# Patient Record
Sex: Male | Born: 2000 | Race: Black or African American | Hispanic: No | Marital: Single | Smoking: Never smoker
Health system: Southern US, Community
[De-identification: ages and names within clinical notes are randomized; demographics above are authoritative.]

## PROBLEM LIST (undated history)

## (undated) DIAGNOSIS — K589 Irritable bowel syndrome without diarrhea: Secondary | ICD-10-CM

## (undated) HISTORY — PX: COLONOSCOPY: SHX174

---
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
## 2021-05-09 ENCOUNTER — Ambulatory Visit (HOSPITAL_COMMUNITY)
Admission: EM | Admit: 2021-05-09 | Discharge: 2021-05-09 | Disposition: A | Discharge status: Home / Self Care | Payer: Medicaid Other | Attending: Psychiatry | Admitting: Psychiatry

## 2021-05-09 ENCOUNTER — Other Ambulatory Visit: Payer: Self-pay

## 2021-05-09 DIAGNOSIS — F4323 Adjustment disorder with mixed anxiety and depressed mood: Secondary | ICD-10-CM | POA: Insufficient documentation

## 2021-05-09 NOTE — Discharge Instructions (Addendum)
Please establish care with a therapist for the new school year.

## 2021-05-09 NOTE — ED Provider Notes (Signed)
[facility] Medical Screening Exam  Patient Name: Kevin Rodgers MRN: [id number] Date of Evaluation: 05/09/21 Chief Complaint:  "I need to show my school I'm getting help." Diagnosis:  Final diagnoses:  Adjustment disorder with mixed anxiety and depressed mood    History of Present illness: Kevin Rodgers is a 20 y.o. male. W/ no prior psych hx who presents today with his mother. Patient reports "I have been feeling depressed for about 1 year and I ned to show my school that I am getting help." Patient's mother is in the room with him, but patient does most of the talking for his exam. 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 Patient reports he transferred from [employer]. [employer] to this college for football and he did well in his first semester and his summer semester; however around 05/2020 patient was cut from the football team. Patient reports he has been playing football since he was 20 yo and began to wonder "What am I supposed to be doing." Patient reports he was attending the school with both academic and sports scholarship and has now lost his sports scholarship. Patient's mother reports she and the patient's father make up the difference and are not concerned; however patient's grades dropped over the last year and patient made 1 F and had a few D's. Patient reports he was having a hard time going to class and endorses hypersomnia and decreased motivation. Patient reports "I don't know what was wrong." Patient reports that he also had significant decrease in appetite, poor concentration, decreased energy and endorse psychomotor retardation. Patient endorsed that lately he has has returned to a more normla sleep schedule since he has been home for the summer, but he notes that he has trouble falling asleep as he finds himself wondering about his future and what he can do without football in his life. Patient reports he  finds himself constantly worrying and wondering about life and "I feel like time is moving too fast." Patient denies SI, HI, and AVH and has not hx of self-injurious behavior nor manic episodes. Patient denies illicit substance use.  Mom denies FH of mental health disorders.   Psychiatric Specialty Exam  Presentation  General Appearance:Appropriate for Environment  Eye Contact:Good  Speech:Clear and Coherent  Speech Volume:Normal  Handedness:No data recorded  Mood and Affect  Mood:Dysphoric  Affect:Flat   Thought Process  Thought Processes:Coherent  Descriptions of Associations:Intact  Orientation:Full (Time, Place and Person)  Thought Content:Logical    Hallucinations:None  Ideas of Reference:None  Suicidal Thoughts:No  Homicidal Thoughts:No   Sensorium  Memory:Immediate Good; Recent Good; Remote Good  Judgment:Fair  Insight:Present   Executive Functions  Concentration:Fair  Attention Span:Good  Recall:Fair  Fund of Knowledge:Good  Language:Good   Psychomotor Activity  Psychomotor Activity:Normal   Assets  Assets:Communication Skills; Desire for Improvement; Housing; Resilience; Social Support   Sleep  Sleep:Fair  Number of hours:  No data recorded  No data recorded  Physical Exam: Physical Exam Constitutional:      Appearance: Normal appearance.  HENT:     Head: Normocephalic and atraumatic.     Nose: Nose normal.  Eyes:     Extraocular Movements: Extraocular movements intact.     Pupils: Pupils are equal, round, and reactive to light.  Cardiovascular:     Rate and Rhythm: Normal rate.     Pulses: Normal pulses.  Pulmonary:     Effort: Pulmonary effort is normal.  Musculoskeletal:        General: Normal range of motion.  Skin:    General: Skin is warm and dry.  Neurological:     General: No focal deficit present.     Mental Status: He is alert.   Review of Systems  Constitutional:  Negative for chills and fever.   HENT:  Negative for hearing loss.   Eyes:  Negative for blurred vision.  Respiratory:  Negative for cough and wheezing.   Cardiovascular:  Negative for chest pain.  Gastrointestinal:  Negative for abdominal pain.  Neurological:  Negative for dizziness.  Psychiatric/Behavioral:  Negative for substance abuse and suicidal ideas.   Blood pressure 125/89, pulse (!) 48, temperature 97.9 F (36.6 C), temperature source Oral, resp. rate 16, SpO2 99 %. There is no height or weight on file to calculate BMI.  Musculoskeletal: Strength & Muscle Tone: within normal limits Gait & Station: normal Patient leans: N/A   [facility] MSE Discharge Disposition for Follow up and Recommendations: Based on my evaluation the patient does not appear to have an emergency medical condition and can be discharged with resources and follow up care in outpatient services for Individual Therapy Adjustment disorder w/ depression and anxiety Patient endorses a significant event that has caused him stress prior to the onset of his depressive and anxious symptoms. It appears that this stressor is directly related to his change in mood. Patient mom noted that patient did not reach out for help during his last school year and has always been reluctant to talk to others when he faces a problem. Both patient and mother agreed that OP therapy would be best for patient. Patient and mother did not feel that patient need a recommendation to psychiatry for medication management at this time. With shared-decision making it was agreed that patient would try therapy first but would consider medication management in the future if he is not having much progress with therapy alone. Mom reported that she would communicate with the patient more to check in how he was doing in school and following through on attending therapy.   - SW provided OP therapy options in the [place] community near patient's school. Patient has personal transportation should he  chose therapy off campus. - School note was provided to patient clearing patient for return to school  PGY-2 Bobbye Morton, MD 05/09/2021, 10:02 AM

## 2021-05-09 NOTE — BH Assessment (Signed)
TTS triage: Patient presents to [name] with his mother, Kevin Rodgers. He reports feeling increasingly depressed for the past year and his grades began to slip at school. He states he needs to show that he has been trying to get help for his mental health. He denies SI/HI/AVH. He denies any substance use or physical concerns.  Patient is routine.

## 2022-10-06 ENCOUNTER — Other Ambulatory Visit: Payer: Self-pay

## 2022-10-06 ENCOUNTER — Emergency Department: Payer: Medicaid Other

## 2022-10-06 ENCOUNTER — Emergency Department
Admission: EM | Admit: 2022-10-06 | Discharge: 2022-10-06 | Disposition: A | Discharge status: Home / Self Care | Payer: Medicaid Other | Attending: Student in an Organized Health Care Education/Training Program | Admitting: Student in an Organized Health Care Education/Training Program

## 2022-10-06 DIAGNOSIS — S93401A Sprain of unspecified ligament of right ankle, initial encounter: Secondary | ICD-10-CM | POA: Insufficient documentation

## 2022-10-06 DIAGNOSIS — Y9289 Other specified places as the place of occurrence of the external cause: Secondary | ICD-10-CM | POA: Insufficient documentation

## 2022-10-06 DIAGNOSIS — S99912A Unspecified injury of left ankle, initial encounter: Secondary | ICD-10-CM | POA: Diagnosis present

## 2022-10-06 DIAGNOSIS — Y998 Other external cause status: Secondary | ICD-10-CM | POA: Diagnosis not present

## 2022-10-06 DIAGNOSIS — Y9389 Activity, other specified: Secondary | ICD-10-CM | POA: Diagnosis not present

## 2022-10-06 MED ORDER — OXYCODONE-ACETAMINOPHEN 5-325 MG PO TABS: 1.0000 | ORAL_TABLET | ORAL | 0 refills | Status: AC | PRN

## 2022-10-06 MED ORDER — OXYCODONE-ACETAMINOPHEN 5-325 MG PO TABS: 1.0000 | ORAL_TABLET | ORAL | 0 refills | Status: DC | PRN

## 2022-10-06 MED ORDER — OXYCODONE-ACETAMINOPHEN 5-325 MG PO TABS
1.0000 | ORAL_TABLET | Freq: Once | ORAL | Status: AC
  Administered 2022-10-06: 1 via ORAL
  Filled 2022-10-06: qty 1

## 2022-10-06 NOTE — ED Triage Notes (Signed)
Pt states he was stepping off his work [employer] and twisting his L ankle- pt states he put his whole weight on the ankle when it happened

## 2022-10-06 NOTE — ED Provider Notes (Signed)
[facility] Provider Note    Event Date/Time   First MD Initiated Contact with Patient 10/06/22 1720     (approximate)   History   Ankle Pain   HPI  Kevin Rodgers is a 22 y.o. 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  Stepped out of the [place] landed on his and rolled left ankle.  Felt his ankle hit the ground.  He denied any pain but did feel a popping sensation in his ankle has had pain with weightbearing since.  Denies any other injury.     Physical Exam   Triage Vital Signs: ED Triage Vitals  Enc Vitals Group     BP 10/06/22 1713 126/82     Pulse Rate 10/06/22 1713 62     Resp 10/06/22 1713 18     Temp 10/06/22 1713 98.4 F (36.9 C)     Temp Source 10/06/22 1713 Oral     SpO2 10/06/22 1713 95 %     Weight 10/06/22 1714 150 lb (68 kg)     Height 10/06/22 1714 6' (1.829 m)     Head Circumference --      Peak Flow --      Pain Score 10/06/22 1714 10     Pain Loc --      Pain Edu? --      Excl. in GC? --     Most recent vital signs: Vitals:   10/06/22 1713  BP: 126/82  Pulse: 62  Resp: 18  Temp: 98.4 F (36.9 C)  SpO2: 95%     Constitutional: Alert  Eyes: Conjunctivae are normal.  Head: Atraumatic. Nose: No congestion/rhinnorhea. Mouth/Throat: Mucous membranes are moist.   Neck: Painless ROM.  Cardiovascular:   Good peripheral circulation. Respiratory: Normal respiratory effort.  No retractions.  Gastrointestinal: Soft and nontender.  Musculoskeletal: There are some swelling over the left lateral ankle.  No effusion.  Neurovascular intact distally.  No midfoot instability.  No proximal tenderness to palpation of the leg. Neurologic:  MAE spontaneously. No gross focal neurologic deficits are appreciated.  Skin:  Skin is warm, dry and intact. No rash noted. Psychiatric: Mood and affect are normal. Speech and behavior are normal.    ED Results / Procedures  / Treatments   Labs (all labs ordered are listed, but only abnormal results are displayed) Labs Reviewed - No data to display   EKG     RADIOLOGY Please see ED Course for my review and interpretation.  I personally reviewed all radiographic images ordered to evaluate for the above acute complaints and reviewed radiology reports and findings.  These findings were personally discussed with the patient.  Please see medical record for radiology report.    PROCEDURES:  Critical Care performed:   Procedures   MEDICATIONS ORDERED IN ED: Medications  oxyCODONE-acetaminophen (PERCOCET/ROXICET) 5-325 MG per tablet 1 tablet (has no administration in time range)     IMPRESSION / MDM / ASSESSMENT AND PLAN / ED COURSE  I reviewed the triage vital signs and the nursing notes.                              Differential diagnosis includes, but is not limited to, fracture, sprain, contusion, dislocation  22 y.o. male with left ankle injury. Patient is AFVSS in ED. Exam as above. Given current presentation have considered the  above differential. Denies any other injuries. Denies motor or sensory loss. Able to bear weight. Afebrile and VSS in Ed. Exam as above. NV intact throughout and distal to injury. Pt able to range joint. No clinical suspicion for infectious process or septic joint. Treatments will include observation, X-rays.  X-rays w/o fracture. No other injuries reported or noted on exam. Presentation c/w sprain. Discussed supportive care and follow up with pt.        FINAL CLINICAL IMPRESSION(S) / ED DIAGNOSES   Final diagnoses:  Sprain of right ankle, unspecified ligament, initial encounter     Rx / DC Orders   ED Discharge Orders          Ordered    oxyCODONE-acetaminophen (PERCOCET) 5-325 MG tablet  Every 4 hours PRN        10/06/22 1750             Note:  This document was prepared using Dragon voice recognition software and may include unintentional  dictation errors.    Merlyn Lot, MD 10/06/22 [phone]

## 2023-08-13 ENCOUNTER — Emergency Department (HOSPITAL_BASED_OUTPATIENT_CLINIC_OR_DEPARTMENT_OTHER)
Admission: EM | Admit: 2023-08-13 | Discharge: 2023-08-13 | Disposition: A | Discharge status: Home / Self Care | Payer: 59 | Attending: Emergency Medicine | Admitting: Emergency Medicine

## 2023-08-13 ENCOUNTER — Other Ambulatory Visit: Payer: Self-pay

## 2023-08-13 ENCOUNTER — Encounter (HOSPITAL_BASED_OUTPATIENT_CLINIC_OR_DEPARTMENT_OTHER): Payer: Self-pay

## 2023-08-13 DIAGNOSIS — H6001 Abscess of right external ear: Secondary | ICD-10-CM | POA: Insufficient documentation

## 2023-08-13 DIAGNOSIS — H9201 Otalgia, right ear: Secondary | ICD-10-CM | POA: Diagnosis present

## 2023-08-13 MED ORDER — DOXYCYCLINE HYCLATE 100 MG PO CAPS: 100.0000 mg | ORAL_CAPSULE | Freq: Two times a day (BID) | ORAL | 0 refills | Status: AC

## 2023-08-13 NOTE — ED Provider Notes (Signed)
[place] EMERGENCY DEPARTMENT AT [facility] Provider Note   CSN: [id number] Arrival date & time: 08/13/23  1849     History  Chief Complaint  Patient presents with   Otalgia    Kevin Rodgers is a 22 y.o. male.  Patient complains of pain in right ear canal.  Patient reports pain to touch.  Patient reports he feels something in his ear canal.  Patient denies any fever or chills he denies any cough or congestion.  Patient denies any sore throat or cough   Otalgia Associated symptoms: no fever        Home Medications Prior to Admission medications   Medication Sig Start Date End Date Taking? Authorizing Provider  doxycycline (VIBRAMYCIN) 100 MG capsule Take 1 capsule (100 mg total) by mouth 2 (two) times daily. 08/13/23  Yes Cheron Schaumann K, PA-C  ibuprofen (ADVIL,MOTRIN) 400 MG tablet Take 1 tablet (400 mg total) by mouth every 6 (six) hours as needed. 02/09/17   Palumbo, April, MD  oxyCODONE-acetaminophen (PERCOCET) 5-325 MG tablet Take 1 tablet by mouth every 4 (four) hours as needed for severe pain. 10/06/22 10/06/23  Willy Eddy, MD      Allergies    Patient has no known allergies.    Review of Systems   Review of Systems  Constitutional:  Negative for fever.  HENT:  Positive for ear pain.   All other systems reviewed and are negative.   Physical Exam Updated Vital Signs BP 129/87   Pulse 61   Temp 98.4 F (36.9 C) (Temporal)   Resp 17   Ht 6' (1.829 m)   Wt 68 kg   SpO2 100%   BMI 20.34 kg/m  Physical Exam Vitals and nursing note reviewed.  Constitutional:      Appearance: He is well-developed.  HENT:     Head: Normocephalic.     Right Ear: Tympanic membrane normal.     Left Ear: Tympanic membrane normal.     Ears:     Comments: Right ear canal.  Area is tender to palpation.    Nose: Nose normal.     Mouth/Throat:     Mouth: Mucous membranes are moist.  Cardiovascular:     Rate and Rhythm: Normal rate.  Pulmonary:     Effort:  Pulmonary effort is normal.  Abdominal:     General: There is no distension.  Musculoskeletal:        General: Normal range of motion.  Skin:    General: Skin is warm.  Neurological:     General: No focal deficit present.     Mental Status: He is alert and oriented to person, place, and time.     ED Results / Procedures / Treatments   Labs (all labs ordered are listed, but only abnormal results are displayed) Labs Reviewed - No data to display  EKG None  Radiology No results found.  Procedures Procedures    Medications Ordered in ED Medications - No data to display  ED Course/ Medical Decision Making/ A&P                                 Medical Decision Making Patient complains of pain in his right ear canal.  Risk Prescription drug management. Risk Details: Patient advised warm compresses 20 minutes 4 times a day for the next 2 days.  Patient is given a prescription for doxycycline.  He is advised to  return if any problems.           Final Clinical Impression(s) / ED Diagnoses Final diagnoses:  Abscess, ear canal, right    Rx / DC Orders ED Discharge Orders          Ordered    doxycycline (VIBRAMYCIN) 100 MG capsule  2 times daily        08/13/23 2116          An After Visit Summary was printed and given to the patient.     Osie Cheeks 08/13/23 2131    Benjiman Core, MD 08/15/23 1004

## 2023-08-13 NOTE — Discharge Instructions (Addendum)
Return if any problems.  Soak area 20 minutes 4 times a day °

## 2023-08-13 NOTE — ED Triage Notes (Signed)
Pt reports pain in R ear that began last week, suspects ear infection
